# Patient Record
Sex: Male | Born: 1985 | Race: White | Hispanic: No | Marital: Single
Health system: Southern US, Community
[De-identification: ages and names within clinical notes are randomized; demographics above are authoritative.]

---

## 2010-11-07 ENCOUNTER — Encounter (HOSPITAL_COMMUNITY): Payer: Self-pay

## 2010-11-07 ENCOUNTER — Emergency Department (HOSPITAL_COMMUNITY)
Admit: 2010-11-07 | Discharge: 2010-11-07 | Disposition: A | Discharge status: Home / Self Care | Payer: No Typology Code available for payment source

## 2010-11-07 ENCOUNTER — Emergency Department (HOSPITAL_COMMUNITY)
Admission: EM | Admit: 2010-11-07 | Discharge: 2010-11-07 | Disposition: A | Discharge status: Home / Self Care | Payer: No Typology Code available for payment source | Attending: Emergency Medicine | Admitting: Emergency Medicine

## 2010-11-07 DIAGNOSIS — M25569 Pain in unspecified knee: Secondary | ICD-10-CM | POA: Insufficient documentation

## 2010-11-07 DIAGNOSIS — Y92410 Unspecified street and highway as the place of occurrence of the external cause: Secondary | ICD-10-CM | POA: Insufficient documentation

## 2012-02-01 IMAGING — CR DG KNEE COMPLETE 4+V*R*
4 series · 4 of 4 positions shown · non-contrast
Comparison: None.

CLINICAL DATA: knee injury and pain.

RIGHT KNEE - COMPLETE 4+ VIEW

[view not recorded (1 of 4)]
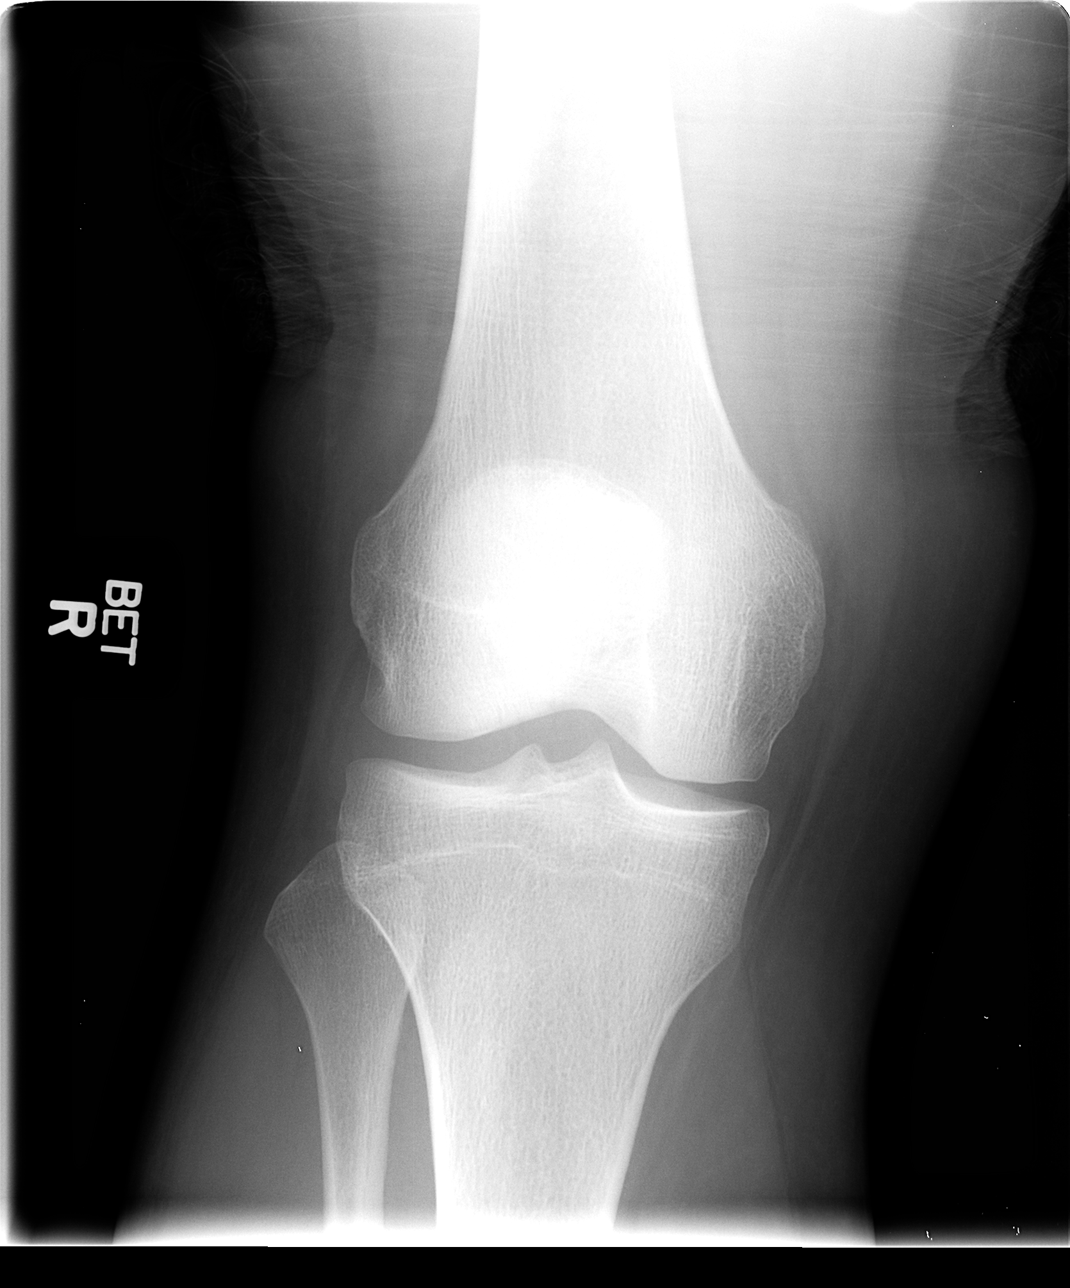

[view not recorded (2 of 4)]
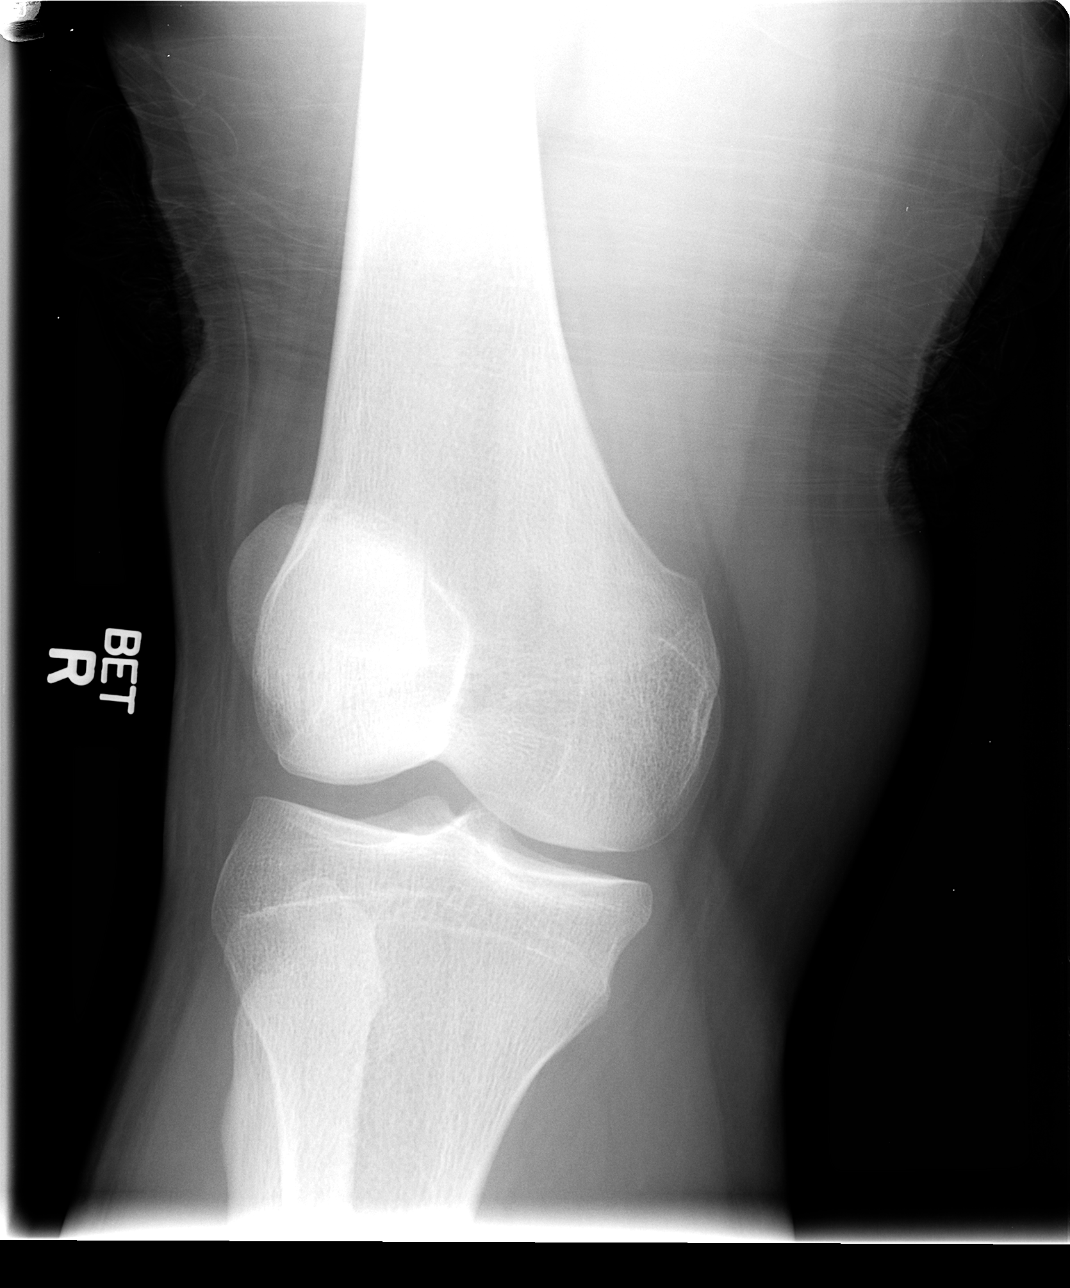

[view not recorded (3 of 4)]
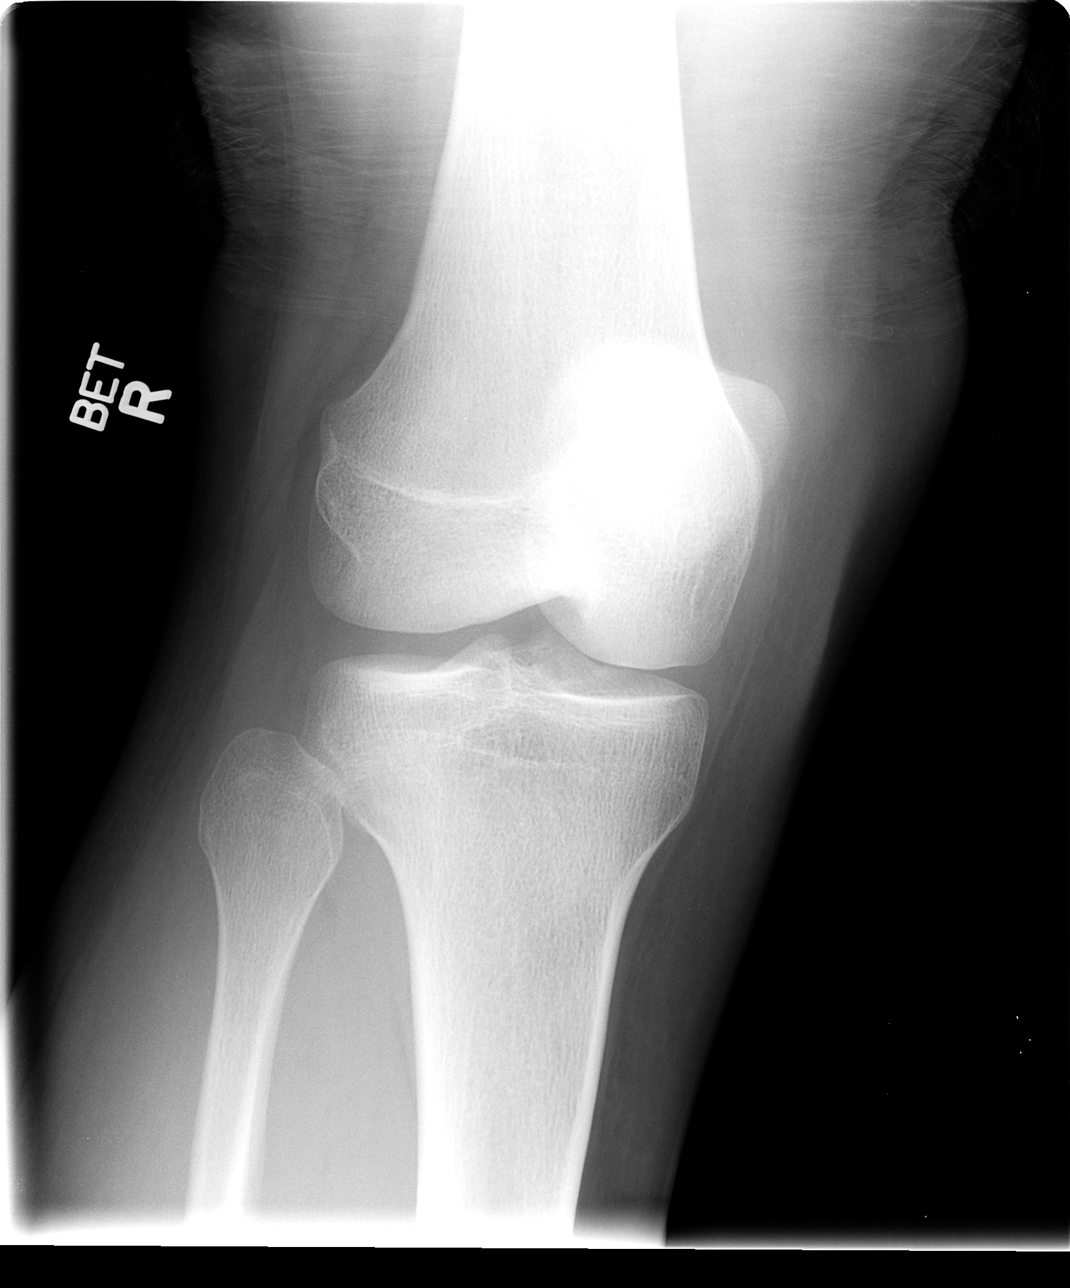

[view not recorded (4 of 4)]
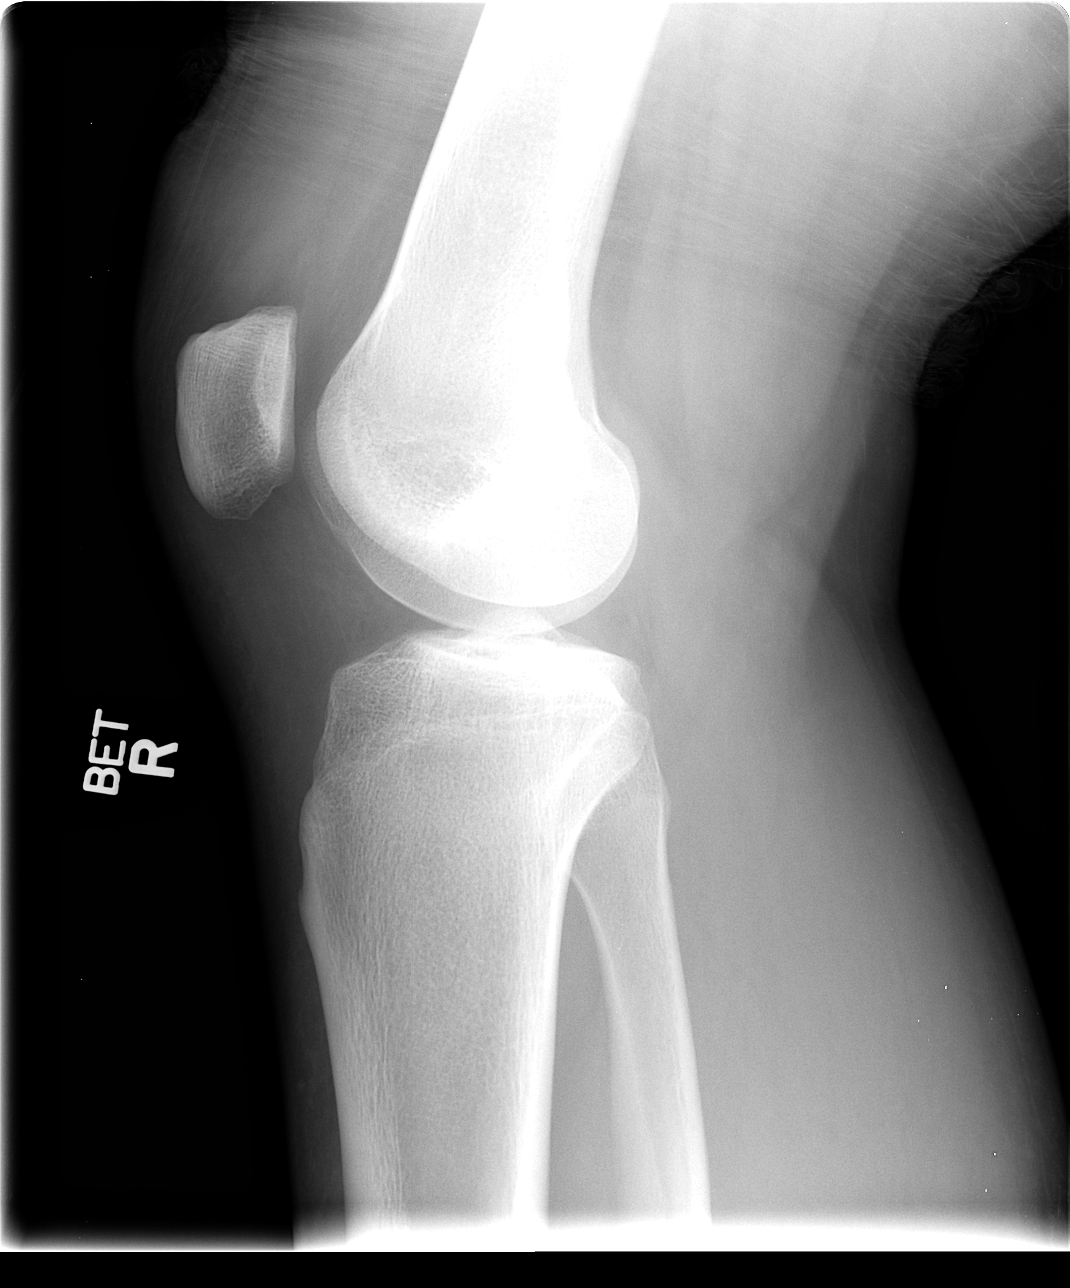

[4 of 4 positions shown; findings below may reference images not displayed]

FINDINGS: There is no evidence of fracture, dislocation, or joint
effusion.  There is no evidence of arthropathy or other focal bone
abnormality.  Soft tissues are unremarkable.
IMPRESSION: Negative.

## 2019-07-29 ENCOUNTER — Other Ambulatory Visit: Payer: Self-pay

## 2019-07-29 DIAGNOSIS — Z20822 Contact with and (suspected) exposure to covid-19: Secondary | ICD-10-CM

## 2019-07-30 LAB — NOVEL CORONAVIRUS, NAA: SARS-CoV-2, NAA: NOT DETECTED

## 2019-09-06 DEATH — deceased

## 2019-11-04 ENCOUNTER — Ambulatory Visit: Payer: Self-pay | Attending: Internal Medicine

## 2019-11-04 ENCOUNTER — Other Ambulatory Visit: Payer: Self-pay

## 2019-11-04 DIAGNOSIS — Z20822 Contact with and (suspected) exposure to covid-19: Secondary | ICD-10-CM

## 2019-11-04 DIAGNOSIS — U071 COVID-19: Secondary | ICD-10-CM | POA: Insufficient documentation

## 2019-11-05 LAB — NOVEL CORONAVIRUS, NAA: SARS-CoV-2, NAA: DETECTED — AB

## 2019-11-07 ENCOUNTER — Telehealth: Payer: Self-pay | Admitting: Nurse Practitioner

## 2019-11-07 NOTE — Telephone Encounter (Signed)
Called to Discuss with patient about Covid symptoms and the use of bamlanivimab, a monoclonal antibody infusion for those with mild to moderate Covid symptoms and at a high risk of hospitalization.     Trying to reach pt to access if he qualifies for infusion.     Unable to reach pt
